# Patient Record
Sex: Male | Born: 1955 | Race: White | Hispanic: No | Marital: Married | Smoking: Never smoker
Health system: Southern US, Community
[De-identification: ages and names within clinical notes are randomized; demographics above are authoritative.]

## PROBLEM LIST (undated history)

## (undated) DIAGNOSIS — E785 Hyperlipidemia, unspecified: Secondary | ICD-10-CM

## (undated) HISTORY — PX: HERNIA REPAIR: SHX51

## (undated) HISTORY — DX: Hyperlipidemia, unspecified: E78.5

---

## 2021-02-26 NOTE — Progress Notes (Signed)
Cardiology Office Note:   Date:  02/27/2021  NAME:  Alec Lewis    MRN: [id number] DOB:  [birth year]   PCP:  Alec Hazel, MD  Cardiologist:  No primary care provider on file.   Referring MD: Alec Hazel, MD   Chief Complaint  Patient presents with  . family history of heart disease   History of Present Illness:   Alec Lewis is a 65 y.o. male with a hx of HLD who is being seen today for the evaluation of family history of heart disease at the request of Alec Hazel, MD.  He presents with his wife.  He had several family members who have had heart attacks.  His brother had an MI at 15.  Father had a heart attack at 55.  His grandfather had a heart attack at 34.  He has been bothered by this.  He actually has been started on Crestor by his primary care physician.  They want to get his LDL cholesterol less than 875.  He denies any symptoms in office today.  He reports he is exercising 4 days/week.  This can include aerobic exercise on a treadmill or exercise bike.  He also can use the elliptical machine.  He exercises for up to 40 to 60 minutes without any significant chest pain or shortness of breath.  His wife reports he can get quite winded.  EKG in office shows normal sinus rhythm without acute ischemic changes or evidence of infarction.  He is a never smoker.  He drinks alcohol rarely.  No illicit drug use reported.  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  They have 1 child and 1 grandchild.  BMI is 32.  He has plans to lose some weight.  He is going to work on his diet.  Again he denies any symptoms of chest pain or shortness of breath.  I have several questions about his risk of having heart attack and stroke.  We discussed calcium scoring for further risk ratification.  They are okay to do this.  Problem List 1. HLD -Total cholesterol 215, HDL 60, LDL 120, triglycerides 199 -10-year ASCVD risk score 12% 2. Obesity  Past Medical  History: Past Medical History:  Diagnosis Date  . Hyperlipidemia     Past Surgical History: Past Surgical History:  Procedure Laterality Date  . HERNIA REPAIR      Current Medications: Current Meds  Medication Sig  . amphetamine-dextroamphetamine (ADDERALL XR) 20 MG 24 hr capsule Take 20 mg by mouth daily.  . fexofenadine (ALLEGRA) 180 MG tablet Take 180 mg by mouth daily.  Marland Kitchen PARoxetine (PAXIL) 40 MG tablet Take 40 mg by mouth every morning.  . rosuvastatin (CRESTOR) 40 MG tablet Take 40 mg by mouth daily.     Allergies:    Patient has no allergy information on record.   Social History: Social History   Socioeconomic History  . Marital status: Married    Spouse name: Not on file  . Number of children: 1  . Years of education: Not on file  . Highest education level: Not on file  Occupational History  . Occupation: retired - Systems analyst  Tobacco Use  . Smoking status: Never Smoker  . Smokeless tobacco: Never Used  Substance and Sexual Activity  . Alcohol use: Yes  . Drug use: Never  . Sexual activity: Not on file  Other Topics Concern  . Not on file  Social History Narrative  . Not on file   Social Determinants of Health   Financial Resource Strain: Not on file  Food Insecurity: Not on file  Transportation Needs: Not on file  Physical Activity: Not on file  Stress: Not on file  Social Connections: Not on file     Family History: The patient's family history includes Heart attack (age of onset: 61) in his brother; Heart attack (age of onset: 81) in his father; Heart attack (age of onset: 82) in his paternal grandfather.  ROS:   All other ROS reviewed and negative. Pertinent positives noted in the HPI.     EKGs/Labs/Other Studies Reviewed:   The following studies were personally reviewed by me today:  EKG:  EKG is ordered today.  The ekg ordered today demonstrates normal sinus rhythm heart rate 62, no acute ischemic changes, no evidence of infarction,  and was personally reviewed by me.   Recent Labs: No results found for requested labs within last 8760 hours.   Recent Lipid Panel No results found for: CHOL, TRIG, HDL, CHOLHDL, VLDL, LDLCALC, LDLDIRECT  Physical Exam:   VS:  BP 130/76 (BP Location: Left Arm, Patient Position: Sitting)   Pulse 62   Ht 6' (1.829 m)   Wt 234 lb 12.8 oz (106.5 kg)   SpO2 96%   BMI 31.84 kg/m    Wt Readings from Last 3 Encounters:  02/27/21 234 lb 12.8 oz (106.5 kg)    General: Well nourished, well developed, in no acute distress Head: Atraumatic, normal size  Eyes: PEERLA, EOMI  Neck: Supple, no JVD Endocrine: No thryomegaly Cardiac: Normal S1, S2; RRR; no murmurs, rubs, or gallops Lungs: Clear to auscultation bilaterally, no wheezing, rhonchi or rales  Abd: Soft, nontender, no hepatomegaly  Ext: No edema, pulses 2+ Musculoskeletal: No deformities, BUE and BLE strength normal and equal Skin: Warm and dry, no rashes   Neuro: Alert and oriented to person, place, time, and situation, CNII-XII grossly intact, no focal deficits  Psych: Normal mood and affect   ASSESSMENT:   Alec Lewis is a 65 y.o. male who presents for the following: 1. Family history of heart disease   2. Mixed hyperlipidemia     PLAN:   1. Family history of heart disease 2. Mixed hyperlipidemia -No symptoms in office today.  EKG shows sinus rhythm without ischemic changes or evidence of infarction.  Family history is very strong for heart disease.  He exercises 4 days/week up to 60 minutes without any significant chest pain.  His most recent LDL cholesterol was 120.  Recently switched to Crestor 20 mg daily.  10-year ASCVD risk score is 12%.  We discussed coronary calcium score for further risk ratification.  I also discussed this may be anxiety plan for stress test.  It is okay to do this.  Given his family history I would recommend an LDL goal less than 70.  We will further restratify him with calcium scoring and see him  back based on the results of this.  Disposition: Return if symptoms worsen or fail to improve.  Medication Adjustments/Labs and Tests Ordered: Current medicines are reviewed at length with the patient today.  Concerns regarding medicines are outlined above.  Orders Placed This Encounter  Procedures  . CT CARDIAC SCORING (SELF PAY ONLY)  . EKG 12-Lead   No orders of the defined types were placed in this encounter.   Patient Instructions  Medication Instructions:  The current medical regimen is effective;  continue present plan and medications.  *If you need a refill on your cardiac medications before your next appointment, please call your pharmacy*   Testing/Procedures: CALCIUM SCORE    Follow-Up: At [facility], you and your health needs are our priority.  As part of our continuing mission to provide you with exceptional heart care, we have created designated Provider Care Teams.  These Care Teams include your primary Cardiologist (physician) and Advanced Practice Providers (APPs -  Physician Assistants and Nurse Practitioners) who all work together to provide you with the care you need, when you need it.  We recommend signing up for the patient portal called "MyChart".  Sign up information is provided on this After Visit Summary.  MyChart is used to connect with patients for Virtual Visits (Telemedicine).  Patients are able to view lab/test results, encounter notes, upcoming appointments, etc.  Non-urgent messages can be sent to your provider as well.   To learn more about what you can do with MyChart, go to [email or web address].    Your next appointment:   As needed   The format for your next appointment:   In Person  Provider:   Lennie Odor, MD          Signed, Lenna Gilford. Flora Lipps, MD, [facility]  [facility]  [address], [address] [place], [place] [address] [phone]  02/27/2021 10:08 AM

## 2021-02-27 ENCOUNTER — Encounter: Payer: Self-pay | Admitting: Cardiovascular Disease

## 2021-02-27 ENCOUNTER — Ambulatory Visit (INDEPENDENT_AMBULATORY_CARE_PROVIDER_SITE_OTHER): Payer: Medicare Other | Admitting: Cardiovascular Disease

## 2021-02-27 ENCOUNTER — Other Ambulatory Visit: Payer: Self-pay

## 2021-02-27 VITALS — BP 130/76 | HR 62 | Ht 72.0 in | Wt 234.8 lb

## 2021-02-27 DIAGNOSIS — E782 Mixed hyperlipidemia: Secondary | ICD-10-CM | POA: Diagnosis not present

## 2021-02-27 DIAGNOSIS — Z8249 Family history of ischemic heart disease and other diseases of the circulatory system: Secondary | ICD-10-CM | POA: Diagnosis not present

## 2021-02-27 NOTE — Patient Instructions (Signed)
Medication Instructions:  The current medical regimen is effective;  continue present plan and medications.  *If you need a refill on your cardiac medications before your next appointment, please call your pharmacy*   Testing/Procedures: CALCIUM SCORE   Follow-Up: At [facility], you and your health needs are our priority.  As part of our continuing mission to provide you with exceptional heart care, we have created designated Provider Care Teams.  These Care Teams include your primary Cardiologist (physician) and Advanced Practice Providers (APPs -  Physician Assistants and Nurse Practitioners) who all work together to provide you with the care you need, when you need it.  We recommend signing up for the patient portal called "MyChart".  Sign up information is provided on this After Visit Summary.  MyChart is used to connect with patients for Virtual Visits (Telemedicine).  Patients are able to view lab/test results, encounter notes, upcoming appointments, etc.  Non-urgent messages can be sent to your provider as well.   To learn more about what you can do with MyChart, go to [email or web address].    Your next appointment:   As needed  The format for your next appointment:   In Person  Provider:   Navajo O'Neal, MD     

## 2021-04-01 ENCOUNTER — Encounter (INDEPENDENT_AMBULATORY_CARE_PROVIDER_SITE_OTHER): Payer: Self-pay

## 2021-04-01 ENCOUNTER — Ambulatory Visit (INDEPENDENT_AMBULATORY_CARE_PROVIDER_SITE_OTHER)
Admission: RE | Admit: 2021-04-01 | Discharge: 2021-04-01 | Disposition: A | Discharge status: Home / Self Care | Payer: Medicare Other | Source: Ambulatory Visit | Attending: Cardiovascular Disease | Admitting: Cardiovascular Disease

## 2021-04-01 ENCOUNTER — Other Ambulatory Visit: Payer: Self-pay

## 2021-04-01 DIAGNOSIS — Z8249 Family history of ischemic heart disease and other diseases of the circulatory system: Secondary | ICD-10-CM

## 2021-04-10 ENCOUNTER — Telehealth: Payer: Self-pay | Admitting: Cardiovascular Disease

## 2021-04-10 NOTE — Telephone Encounter (Signed)
Patient returning call for CT results. 

## 2021-04-10 NOTE — Telephone Encounter (Signed)
Called patient, gave results.  See result note.  Patient verbalized understanding.

## 2022-01-17 IMAGING — CT CT CARDIAC CORONARY ARTERY CALCIUM SCORE
3 series · 13 of 20 positions shown, 14 images · non-contrast
Comparison: None.
COMPARISON: None.

Addendum:
EXAM:
OVER-READ INTERPRETATION  CT CHEST

The following report is an over-read performed by radiologist Dr.
Danii Aujla [REDACTED] on 04/01/2021. This
over-read does not include interpretation of cardiac or coronary
anatomy or pathology. The coronary calcium score interpretation by
the cardiologist is attached.
CLINICAL DATA: Risk stratification: 65 Year-old Caucasian male
Coronary Calcium Score
TECHNIQUE: The patient was scanned on a Siemens Force scanner. Axial
non-contrast 3 mm slices were carried out through the heart. The
data set was analyzed on a dedicated work station and scored using
the Agatson method.

[Series 2: casc 3.0 bv41 2 bestdiast 68 % · axial · 0.41mm/px · z∈[-242,-154]mm · 4 of 49 slices shown, 5 images]
[im 10/49  vessel]
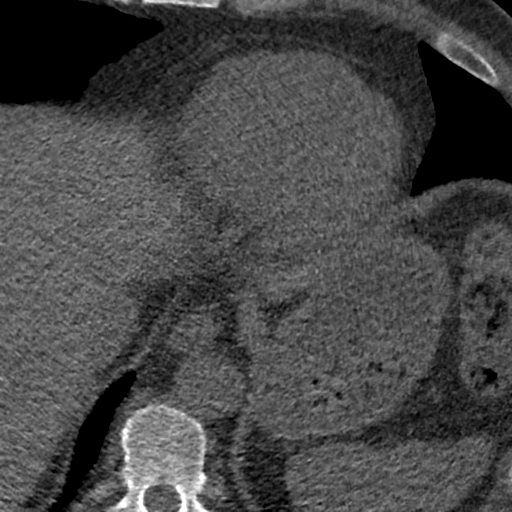
[im 10/49  lung]
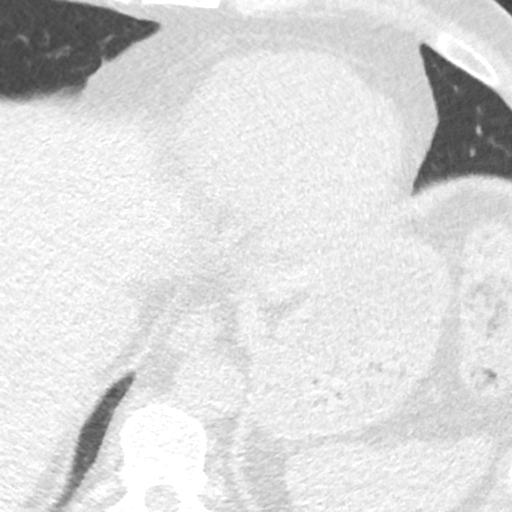
[im 20/49  vessel]
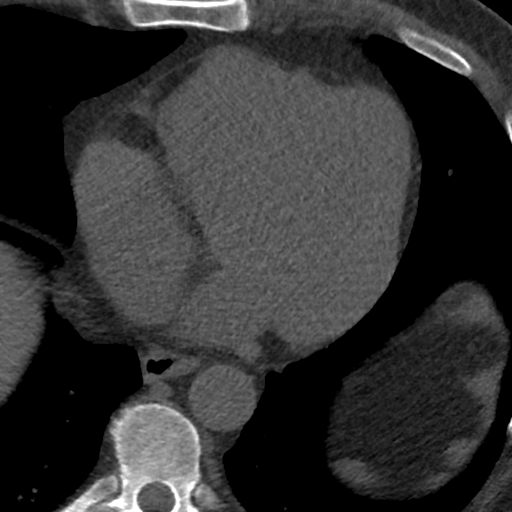
[im 29/49  vessel]
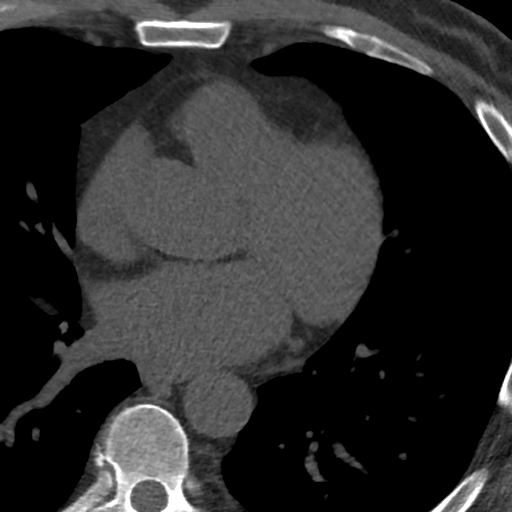
[im 39/49  vessel]
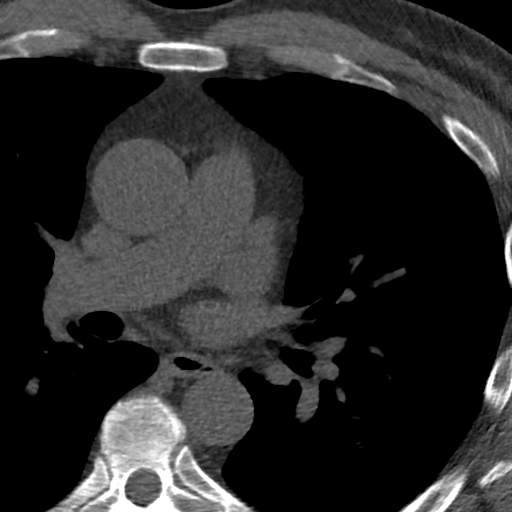

[Series 3: lung 71 % · axial · 0.68mm/px · z∈[-220,-148]mm · 4 of 49 slices shown]
[im 17/49  lung]
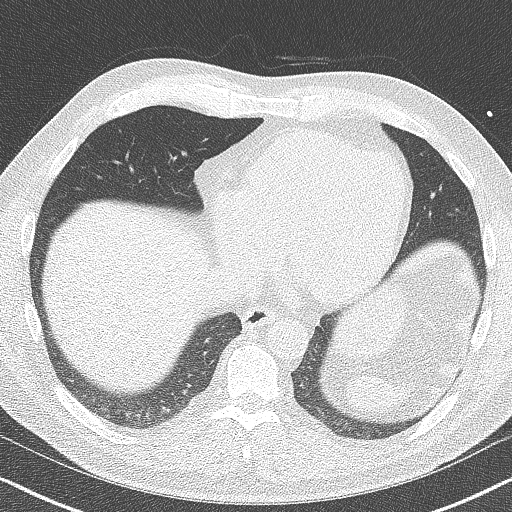
[im 25/49  lung]
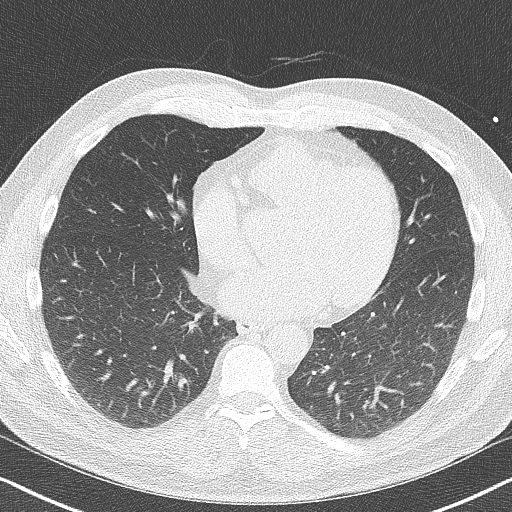
[im 33/49  lung]
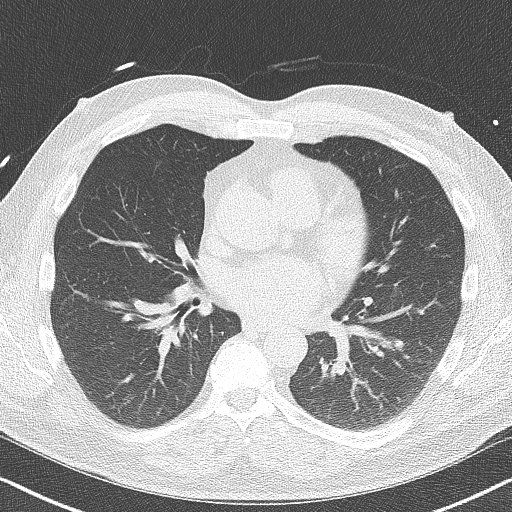
[im 41/49  lung]
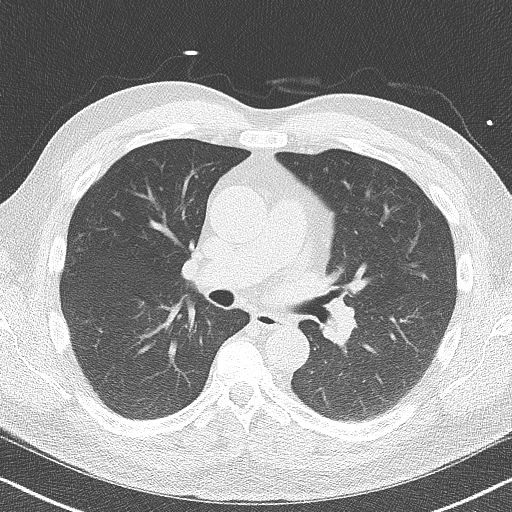

[Series 4: lung st 71 % · axial · 0.68mm/px · z∈[-244,-148]mm · 5 of 49 slices shown]
[im 9/49  lung]
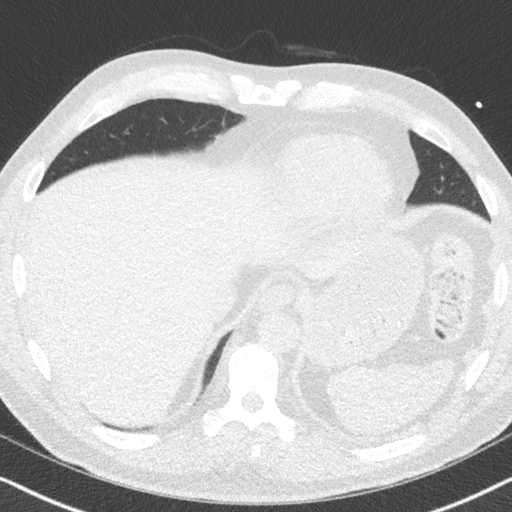
[im 17/49  lung]
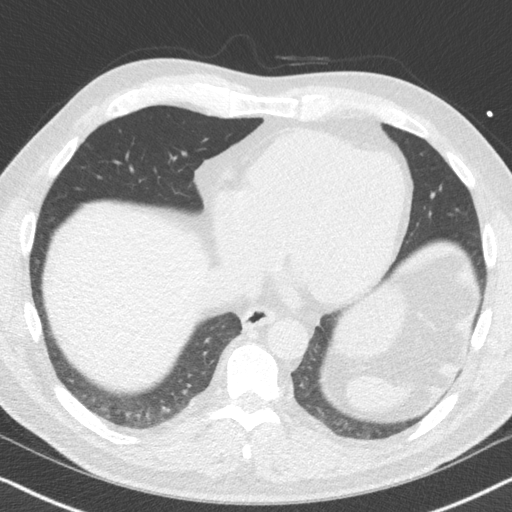
[im 25/49  lung]
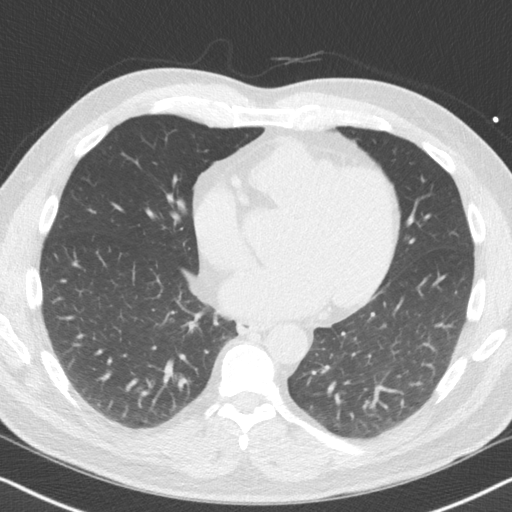
[im 33/49  lung]
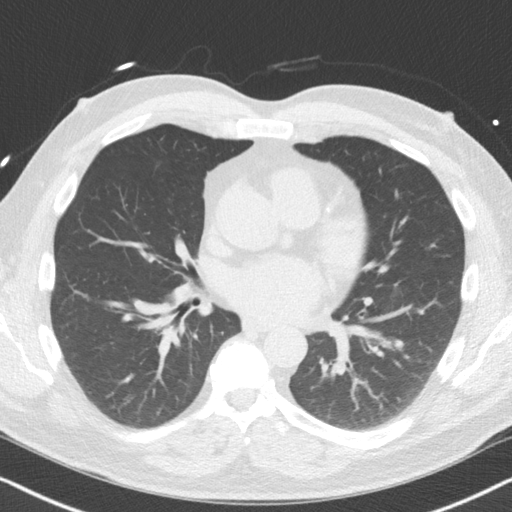
[im 41/49  lung]
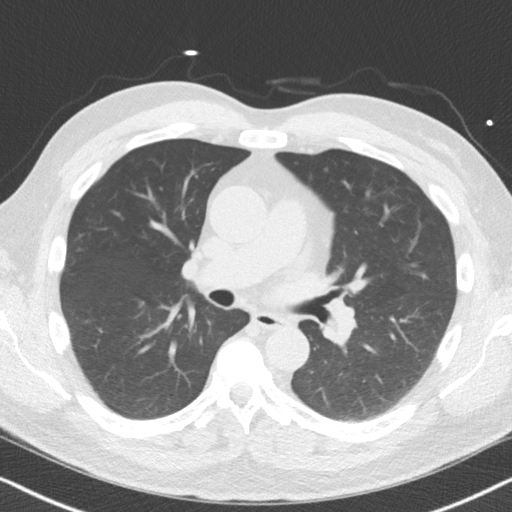

[13 of 20 positions shown; findings below may reference images not displayed]

FINDINGS: Within the visualized portions of the thorax there are no suspicious
appearing pulmonary nodules or masses, there is no acute
consolidative airspace disease, no pleural effusions, no
pneumothorax and no lymphadenopathy. Visualized portions of the
upper abdomen are unremarkable. There are no aggressive appearing
lytic or blastic lesions noted in the visualized portions of the
skeleton.
IMPRESSION: 1. No significant incidental noncardiac findings are noted.
FINDINGS: Non-cardiac: See separate report from [REDACTED].

Ascending Aorta: Mildly dilated on non-contrasted study: 41 mm.

Pericardium: Normal.

Coronary arteries: Normal origins.

Coronary Calcium Score:

Left main: 0

Left anterior descending artery: 5

Left circumflex artery: 0

Right coronary artery: 2

Total: 7

Percentile: 26th for age, sex, and race matched control.
IMPRESSION: 1. Coronary calcium score of 7. This was 26th percentile for age,
gender, and race matched controls.

2. Mildly dilated ascending aorta on non-contrasted study: 41 mm.
Consider secondary imaging (echocardiogram/CTA/MRA) if clinically
indicated.

RECOMMENDATIONS:

Coronary artery calcium (CAC) score is a strong predictor of
incident coronary heart disease (CHD) and provides predictive
information beyond traditional risk factors. CAC scoring is
reasonable to use in the decision to withhold, postpone, or initiate
statin therapy in intermediate-risk or selected borderline-risk
asymptomatic adults (age 40-75 years and LDL-C ?70 to <190 mg/dL)
who do not have diabetes or established atherosclerotic
cardiovascular disease (ASCVD).* In intermediate-risk (10-year ASCVD
risk ?7.5% to <20%) adults or selected borderline-risk (10-year
ASCVD risk ?5% to <7.5%) adults in whom a CAC score is measured for
the purpose of making a treatment decision the following
recommendations have been made:

If CAC = 0, it is reasonable to withhold statin therapy and reassess
in 5 to 10 years, as long as higher risk conditions are absent
(diabetes mellitus, family history of premature CHD in first degree
relatives (males <55 years; females <65 years), cigarette smoking,
LDL ?190 mg/dL or other independent risk factors).

If CAC is 1 to 99, it is reasonable to initiate statin therapy for
patients ?55 years of age.

If CAC is ?100 or ?75th percentile, it is reasonable to initiate
statin therapy at any age.

Cardiology referral should be considered for patients with CAC
scores ?400 or ?75th percentile.

*7720 AHA/ACC/AACVPR/AAPA/ABC/ADLAHO/AGGARWAL/PIKININI/Sachuda/PAULUS N/SABELLO/GUSAR
Guideline on the Management of Blood Cholesterol: A Report of the
American College of Cardiology/American Heart Association Task Force
on Clinical Practice Guidelines. J Am Coll Cardiol.
0604;73(24):5929-5380.

*** End of Addendum ***
EXAM:
OVER-READ INTERPRETATION  CT CHEST

The following report is an over-read performed by radiologist Dr.
Danii Aujla [REDACTED] on 04/01/2021. This
over-read does not include interpretation of cardiac or coronary
anatomy or pathology. The coronary calcium score interpretation by
the cardiologist is attached.
FINDINGS: Within the visualized portions of the thorax there are no suspicious
appearing pulmonary nodules or masses, there is no acute
consolidative airspace disease, no pleural effusions, no
pneumothorax and no lymphadenopathy. Visualized portions of the
upper abdomen are unremarkable. There are no aggressive appearing
lytic or blastic lesions noted in the visualized portions of the
skeleton.
IMPRESSION: 1. No significant incidental noncardiac findings are noted.
